# Patient Record
Sex: Female | Born: 1970 | Race: White | Hispanic: No | Smoking: Never smoker
Health system: Southern US, Community
[De-identification: ages and names within clinical notes are randomized; demographics above are authoritative.]

## PROBLEM LIST (undated history)

## (undated) DIAGNOSIS — F32A Depression, unspecified: Secondary | ICD-10-CM

## (undated) DIAGNOSIS — E079 Disorder of thyroid, unspecified: Secondary | ICD-10-CM

## (undated) DIAGNOSIS — F329 Major depressive disorder, single episode, unspecified: Secondary | ICD-10-CM

## (undated) HISTORY — PX: NASAL FRACTURE SURGERY: SHX718

---

## 2000-03-19 ENCOUNTER — Emergency Department (HOSPITAL_COMMUNITY): Admission: EM | Admit: 2000-03-19 | Discharge: 2000-03-19 | Payer: Self-pay | Admitting: Emergency Medicine

## 2000-11-16 ENCOUNTER — Other Ambulatory Visit: Admission: RE | Admit: 2000-11-16 | Discharge: 2000-11-16 | Payer: Self-pay | Admitting: Gynecology

## 2000-11-16 ENCOUNTER — Encounter (INDEPENDENT_AMBULATORY_CARE_PROVIDER_SITE_OTHER): Payer: Self-pay | Admitting: Specialist

## 2004-01-18 ENCOUNTER — Emergency Department (HOSPITAL_COMMUNITY): Admission: EM | Admit: 2004-01-18 | Discharge: 2004-01-18 | Payer: Self-pay | Admitting: Emergency Medicine

## 2004-03-08 ENCOUNTER — Emergency Department (HOSPITAL_COMMUNITY): Admission: EM | Admit: 2004-03-08 | Discharge: 2004-03-08 | Payer: Self-pay | Admitting: Emergency Medicine

## 2005-07-25 ENCOUNTER — Ambulatory Visit: Payer: Self-pay | Admitting: Gastroenterology

## 2008-12-27 ENCOUNTER — Emergency Department (HOSPITAL_BASED_OUTPATIENT_CLINIC_OR_DEPARTMENT_OTHER): Admission: EM | Admit: 2008-12-27 | Discharge: 2008-12-27 | Payer: Self-pay | Admitting: Emergency Medicine

## 2009-05-27 ENCOUNTER — Encounter: Admission: RE | Admit: 2009-05-27 | Discharge: 2009-05-27 | Payer: Self-pay | Admitting: Family Medicine

## 2010-09-22 ENCOUNTER — Ambulatory Visit
Admission: RE | Admit: 2010-09-22 | Discharge: 2010-09-22 | Disposition: A | Discharge status: Home / Self Care | Payer: 59 | Source: Ambulatory Visit | Attending: Sports Medicine | Admitting: Sports Medicine

## 2010-09-22 ENCOUNTER — Other Ambulatory Visit: Payer: Self-pay | Admitting: Sports Medicine

## 2010-09-22 DIAGNOSIS — M25561 Pain in right knee: Secondary | ICD-10-CM

## 2010-09-22 DIAGNOSIS — M545 Low back pain: Secondary | ICD-10-CM

## 2010-09-22 DIAGNOSIS — M542 Cervicalgia: Secondary | ICD-10-CM

## 2011-03-24 ENCOUNTER — Emergency Department (HOSPITAL_BASED_OUTPATIENT_CLINIC_OR_DEPARTMENT_OTHER)
Admission: EM | Admit: 2011-03-24 | Discharge: 2011-03-25 | Disposition: A | Discharge status: Home / Self Care | Payer: 59 | Attending: Emergency Medicine | Admitting: Emergency Medicine

## 2011-03-24 ENCOUNTER — Emergency Department (INDEPENDENT_AMBULATORY_CARE_PROVIDER_SITE_OTHER): Payer: 59

## 2011-03-24 ENCOUNTER — Encounter: Payer: Self-pay | Admitting: *Deleted

## 2011-03-24 DIAGNOSIS — M542 Cervicalgia: Secondary | ICD-10-CM | POA: Insufficient documentation

## 2011-03-24 DIAGNOSIS — M25519 Pain in unspecified shoulder: Secondary | ICD-10-CM

## 2011-03-24 DIAGNOSIS — Y92410 Unspecified street and highway as the place of occurrence of the external cause: Secondary | ICD-10-CM | POA: Insufficient documentation

## 2011-03-24 HISTORY — DX: Depression, unspecified: F32.A

## 2011-03-24 HISTORY — DX: Major depressive disorder, single episode, unspecified: F32.9

## 2011-03-24 MED ORDER — IBUPROFEN 800 MG PO TABS: 800.0000 mg | ORAL_TABLET | Freq: Three times a day (TID) | ORAL | Status: AC

## 2011-03-24 MED ORDER — HYDROCODONE-ACETAMINOPHEN 5-325 MG PO TABS: 2.0000 | ORAL_TABLET | ORAL | Status: AC | PRN

## 2011-03-24 MED ORDER — IBUPROFEN 800 MG PO TABS
800.0000 mg | ORAL_TABLET | Freq: Once | ORAL | Status: AC
  Administered 2011-03-24: 800 mg via ORAL
  Filled 2011-03-24: qty 1

## 2011-03-24 MED ORDER — OXYCODONE-ACETAMINOPHEN 5-325 MG PO TABS: 2.0000 | ORAL_TABLET | ORAL | Status: AC | PRN
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
## 2011-03-24 NOTE — ED Provider Notes (Signed)
Medical screening examination/treatment/procedure(s) were performed by non-physician practitioner and as supervising physician I was immediately available for consultation/collaboration.   Leigh-Ann Webb, MD 03/24/11 2326 

## 2011-03-25 ENCOUNTER — Other Ambulatory Visit (HOSPITAL_BASED_OUTPATIENT_CLINIC_OR_DEPARTMENT_OTHER): Payer: 59

## 2011-03-25 NOTE — ED Notes (Signed)
Pt placed in collar and instructed on usage. Aware to leave in place constantly for the next 2 weeks until f/u with neurosurgery. Verbalized understanding.

## 2011-04-15 NOTE — ED Provider Notes (Signed)
Medical screening examination/treatment/procedure(s) were performed by non-physician practitioner and as supervising physician I was immediately available for consultation/collaboration.   Leigh-Ann Rosilyn Coachman, MD 04/15/11 0124 

## 2011-07-17 ENCOUNTER — Emergency Department (HOSPITAL_BASED_OUTPATIENT_CLINIC_OR_DEPARTMENT_OTHER)
Admission: EM | Admit: 2011-07-17 | Discharge: 2011-07-17 | Disposition: A | Discharge status: Home / Self Care | Payer: 59 | Attending: Emergency Medicine | Admitting: Emergency Medicine

## 2011-07-17 ENCOUNTER — Emergency Department (INDEPENDENT_AMBULATORY_CARE_PROVIDER_SITE_OTHER): Payer: 59

## 2011-07-17 ENCOUNTER — Encounter (HOSPITAL_BASED_OUTPATIENT_CLINIC_OR_DEPARTMENT_OTHER): Payer: Self-pay | Admitting: *Deleted

## 2011-07-17 DIAGNOSIS — Z79899 Other long term (current) drug therapy: Secondary | ICD-10-CM | POA: Insufficient documentation

## 2011-07-17 DIAGNOSIS — R05 Cough: Secondary | ICD-10-CM

## 2011-07-17 DIAGNOSIS — R509 Fever, unspecified: Secondary | ICD-10-CM

## 2011-07-17 DIAGNOSIS — R059 Cough, unspecified: Secondary | ICD-10-CM | POA: Insufficient documentation

## 2011-07-17 DIAGNOSIS — J40 Bronchitis, not specified as acute or chronic: Secondary | ICD-10-CM | POA: Insufficient documentation

## 2011-07-17 NOTE — ED Provider Notes (Signed)
History     CSN: [id number]  Arrival date & time 07/17/11  0614   First MD Initiated Contact with Patient 07/17/11 [phone]      Chief Complaint  Patient presents with  . Nasal Congestion  . Cough    (Consider location/radiation/quality/duration/timing/severity/associated sxs/prior treatment) HPI  Patient states she has had uri symptoms which then changed to cough and subjective fever 2 days ago.  Some dyspnea with occasionally productive cough.  No history of asthma.  Patient taking po well.  Some sick contacts in house hold.  No history of pneumonia, asthma, and not a smoker.    Past Medical History  Diagnosis Date  . Depression   . Arthritis     History reviewed. No pertinent past surgical history.  History reviewed. No pertinent family history.  History  Substance Use Topics  . Smoking status: Never Smoker   . Smokeless tobacco: Not on file  . Alcohol Use: No    OB History    Grav Para Term Preterm Abortions TAB SAB Ect Mult Living                  Review of Systems  All other systems reviewed and are negative.    Allergies  Review of patient's allergies indicates no known allergies.  Home Medications   Current Outpatient Rx  Name Route Sig Dispense Refill  . CITALOPRAM HYDROBROMIDE 40 MG PO TABS Oral Take 40 mg by mouth daily.      Marland Kitchen LEVOTHYROXINE SODIUM 75 MCG PO TABS Oral Take 75 mcg by mouth daily.      Marland Kitchen BETAMETHASONE DIPROPIONATE AUG 0.05 % EX OINT Topical Apply topically as needed. For flare up     . CELECOXIB 200 MG PO CAPS Oral Take 200 mg by mouth daily.      Marland Kitchen FLUOXETINE HCL 20 MG PO CAPS Oral Take 20 mg by mouth daily.        BP 123/71  Pulse 86  Temp(Src) 97.8 F (36.6 C) (Oral)  Resp 14  Ht 5\' 5"  (1.651 m)  Wt 238 lb (107.956 kg)  BMI 39.61 kg/m2  SpO2 96%  LMP 06/21/2011  Physical Exam  Nursing note and vitals reviewed. Constitutional: She is oriented to person, place, and time. She appears well-developed and well-nourished.         Morbidly obese  HENT:  Head: Normocephalic and atraumatic.  Right Ear: External ear normal.  Left Ear: External ear normal.  Nose: Nose normal.  Mouth/Throat: Oropharynx is clear and moist.  Eyes: Conjunctivae are normal. Pupils are equal, round, and reactive to light.  Neck: Normal range of motion. Neck supple.  Cardiovascular: Normal rate and regular rhythm.   Pulmonary/Chest: Effort normal and breath sounds normal.  Abdominal: Soft.  Musculoskeletal: Normal range of motion.  Neurological: She is alert and oriented to person, place, and time.  Skin: Skin is warm and dry.  Psychiatric: She has a normal mood and affect.    ED Course  Procedures (including critical care time)  Labs Reviewed - No data to display No results found.   No diagnosis found.    MDM          Hilario Quarry, MD 07/17/11 [phone]

## 2011-07-17 NOTE — ED Notes (Signed)
Pt presented to the ED with no respiratory hx but has had a cold.

## 2011-07-17 NOTE — ED Notes (Signed)
Pt states that she has had URI sx x one weeks states that she feels like it has gotten into her chest she may have had a fever yesterday but is not sure

## 2011-07-17 NOTE — ED Notes (Signed)
Pt ambulatory to radiology

## 2011-07-17 NOTE — ED Notes (Signed)
Report received from [place], [place] care assumed.

## 2012-03-15 ENCOUNTER — Encounter (HOSPITAL_BASED_OUTPATIENT_CLINIC_OR_DEPARTMENT_OTHER): Payer: Self-pay | Admitting: Emergency Medicine

## 2012-03-15 ENCOUNTER — Emergency Department (HOSPITAL_BASED_OUTPATIENT_CLINIC_OR_DEPARTMENT_OTHER)
Admission: EM | Admit: 2012-03-15 | Discharge: 2012-03-15 | Disposition: A | Discharge status: Home / Self Care | Payer: No Typology Code available for payment source | Attending: Emergency Medicine | Admitting: Emergency Medicine

## 2012-03-15 ENCOUNTER — Emergency Department (HOSPITAL_BASED_OUTPATIENT_CLINIC_OR_DEPARTMENT_OTHER): Payer: No Typology Code available for payment source

## 2012-03-15 DIAGNOSIS — M129 Arthropathy, unspecified: Secondary | ICD-10-CM | POA: Insufficient documentation

## 2012-03-15 DIAGNOSIS — S5010XA Contusion of unspecified forearm, initial encounter: Secondary | ICD-10-CM

## 2012-03-15 DIAGNOSIS — F329 Major depressive disorder, single episode, unspecified: Secondary | ICD-10-CM | POA: Insufficient documentation

## 2012-03-15 DIAGNOSIS — S2239XA Fracture of one rib, unspecified side, initial encounter for closed fracture: Secondary | ICD-10-CM | POA: Insufficient documentation

## 2012-03-15 DIAGNOSIS — F3289 Other specified depressive episodes: Secondary | ICD-10-CM | POA: Insufficient documentation

## 2012-03-15 HISTORY — DX: Disorder of thyroid, unspecified: E07.9

## 2012-03-15 MED ORDER — OXYCODONE-ACETAMINOPHEN 5-325 MG PO TABS
2.0000 | ORAL_TABLET | Freq: Once | ORAL | Status: AC
  Administered 2012-03-15: 2 via ORAL
  Filled 2012-03-15 (×2): qty 2

## 2012-03-15 MED ORDER — IBUPROFEN 800 MG PO TABS: 800.0000 mg | ORAL_TABLET | Freq: Three times a day (TID) | ORAL | Status: AC | PRN

## 2012-03-15 MED ORDER — OXYCODONE-ACETAMINOPHEN 5-325 MG PO TABS
ORAL_TABLET | ORAL | Status: AC
  Filled 2012-03-15: qty 2

## 2012-03-15 MED ORDER — ONDANSETRON HCL 4 MG/2ML IJ SOLN
INTRAMUSCULAR | Status: AC
  Filled 2012-03-15: qty 2

## 2012-03-15 MED ORDER — PROMETHAZINE HCL 25 MG PO TABS
ORAL_TABLET | ORAL | Status: AC
  Filled 2012-03-15: qty 1

## 2012-03-15 MED ORDER — OXYCODONE-ACETAMINOPHEN 5-325 MG PO TABS: 1.0000 | ORAL_TABLET | Freq: Four times a day (QID) | ORAL | Status: AC | PRN

## 2012-03-15 MED ORDER — KETOROLAC TROMETHAMINE 30 MG/ML IJ SOLN
INTRAMUSCULAR | Status: AC
  Filled 2012-03-15: qty 1
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
## 2016-08-13 ENCOUNTER — Encounter (HOSPITAL_BASED_OUTPATIENT_CLINIC_OR_DEPARTMENT_OTHER): Payer: Self-pay | Admitting: *Deleted

## 2016-08-13 ENCOUNTER — Emergency Department (HOSPITAL_BASED_OUTPATIENT_CLINIC_OR_DEPARTMENT_OTHER)
Admission: EM | Admit: 2016-08-13 | Discharge: 2016-08-13 | Disposition: A | Discharge status: Home / Self Care | Payer: Self-pay | Attending: Emergency Medicine | Admitting: Emergency Medicine

## 2016-08-13 ENCOUNTER — Emergency Department (HOSPITAL_BASED_OUTPATIENT_CLINIC_OR_DEPARTMENT_OTHER): Payer: Self-pay

## 2016-08-13 DIAGNOSIS — Y929 Unspecified place or not applicable: Secondary | ICD-10-CM | POA: Insufficient documentation

## 2016-08-13 DIAGNOSIS — W010XXA Fall on same level from slipping, tripping and stumbling without subsequent striking against object, initial encounter: Secondary | ICD-10-CM | POA: Insufficient documentation

## 2016-08-13 DIAGNOSIS — S161XXA Strain of muscle, fascia and tendon at neck level, initial encounter: Secondary | ICD-10-CM | POA: Insufficient documentation

## 2016-08-13 DIAGNOSIS — Y999 Unspecified external cause status: Secondary | ICD-10-CM | POA: Insufficient documentation

## 2016-08-13 DIAGNOSIS — Y939 Activity, unspecified: Secondary | ICD-10-CM | POA: Insufficient documentation

## 2016-08-13 DIAGNOSIS — W19XXXA Unspecified fall, initial encounter: Secondary | ICD-10-CM

## 2016-08-13 MED ORDER — CYCLOBENZAPRINE HCL 10 MG PO TABS: 10.0000 mg | ORAL_TABLET | Freq: Two times a day (BID) | ORAL | 0 refills | Status: AC | PRN

## 2016-08-13 NOTE — ED Triage Notes (Signed)
Pt c/o fall x 4 days ago c/o neck and bil shoulder pain
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
## 2016-08-13 NOTE — ED Notes (Signed)
Pt made aware to return if symptoms worsen or if any life threatening symptoms occur.   

## 2016-08-13 NOTE — ED Notes (Signed)
PA at bedside.

## 2016-08-13 NOTE — Discharge Instructions (Signed)
Your x-rays are normal today. Continue taking Naprosyn for pain. He may also take Flexeril for pain. Do not drive with taking this medicine. Follow-up with her primary care doctor in 1 week. Return to the emergency department for any new or concerning symptoms.

## 2017-10-09 IMAGING — DX DG CERVICAL SPINE COMPLETE 4+V
6 series · 6 of 6 positions shown · non-contrast
Comparison: 04/17/2011

CLINICAL DATA: Fall 4 days ago, left side neck pain, bilateral
shoulder pain

EXAM:
CERVICAL SPINE - COMPLETE 4+ VIEW

[c-spine lat]
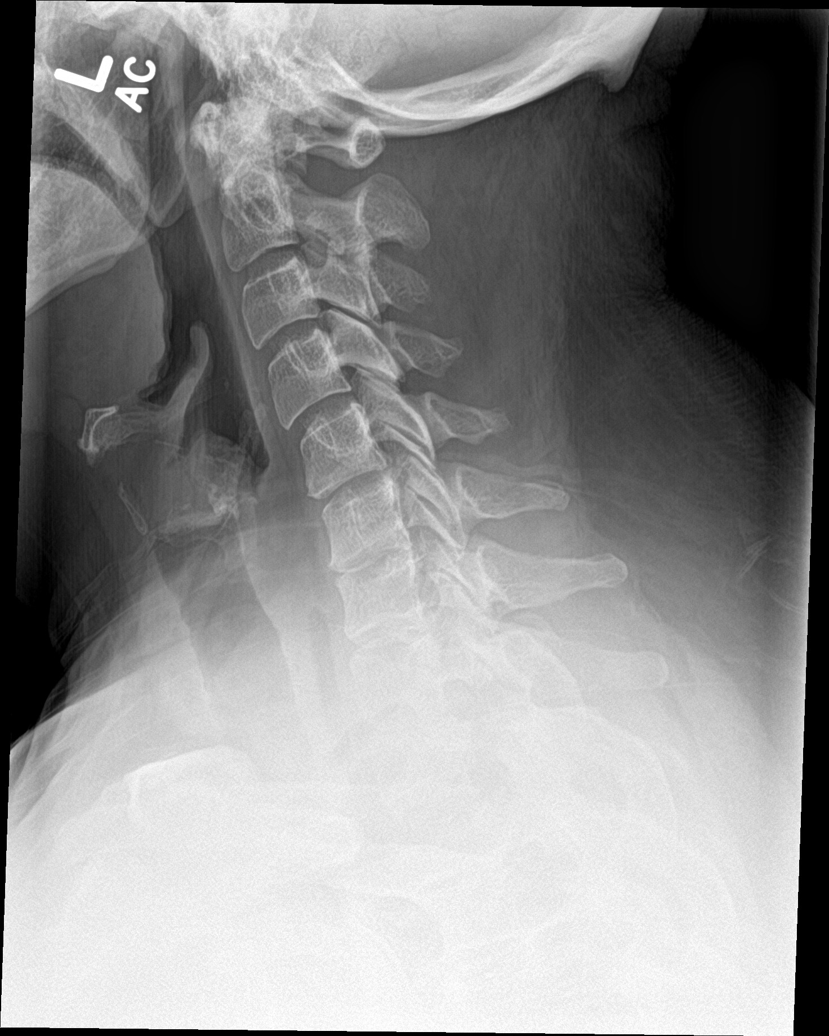

[c-spine obl (1 of 2)]
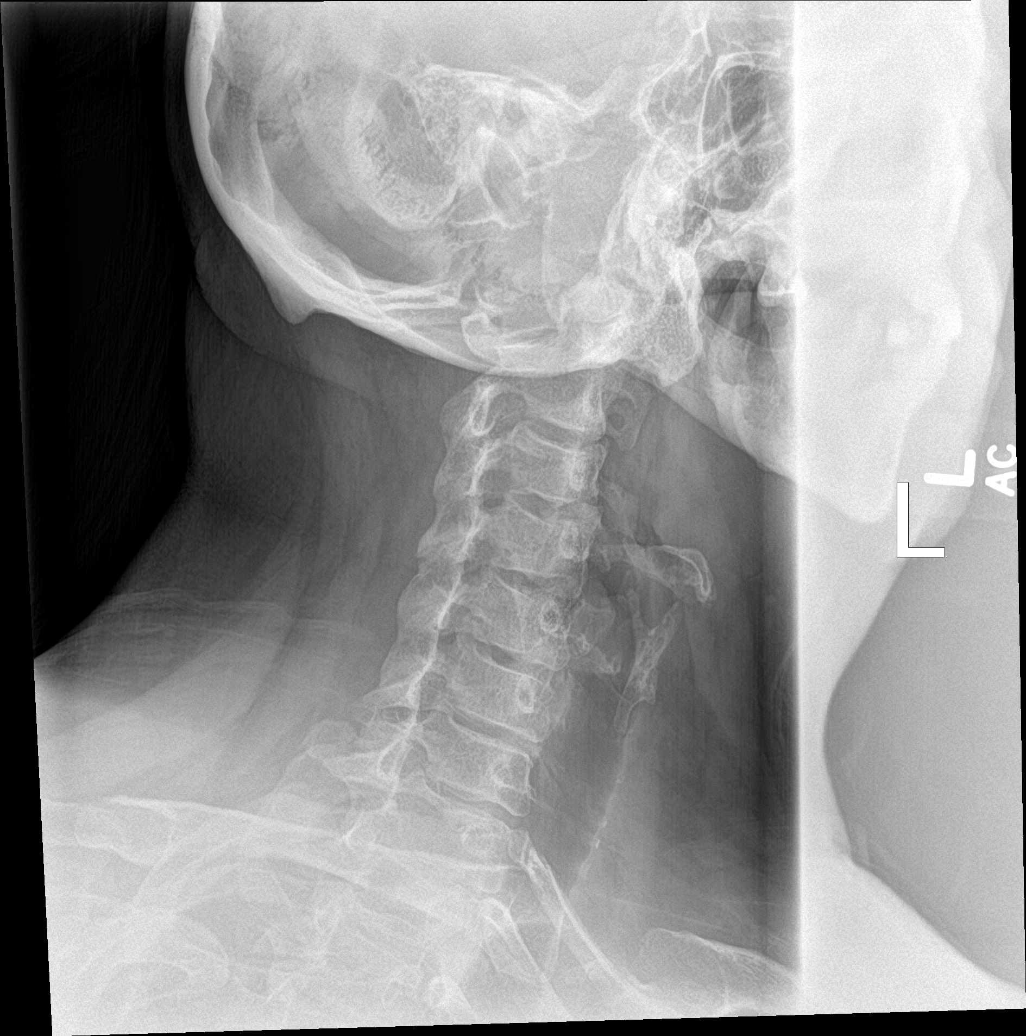

[c-spine obl (2 of 2)]
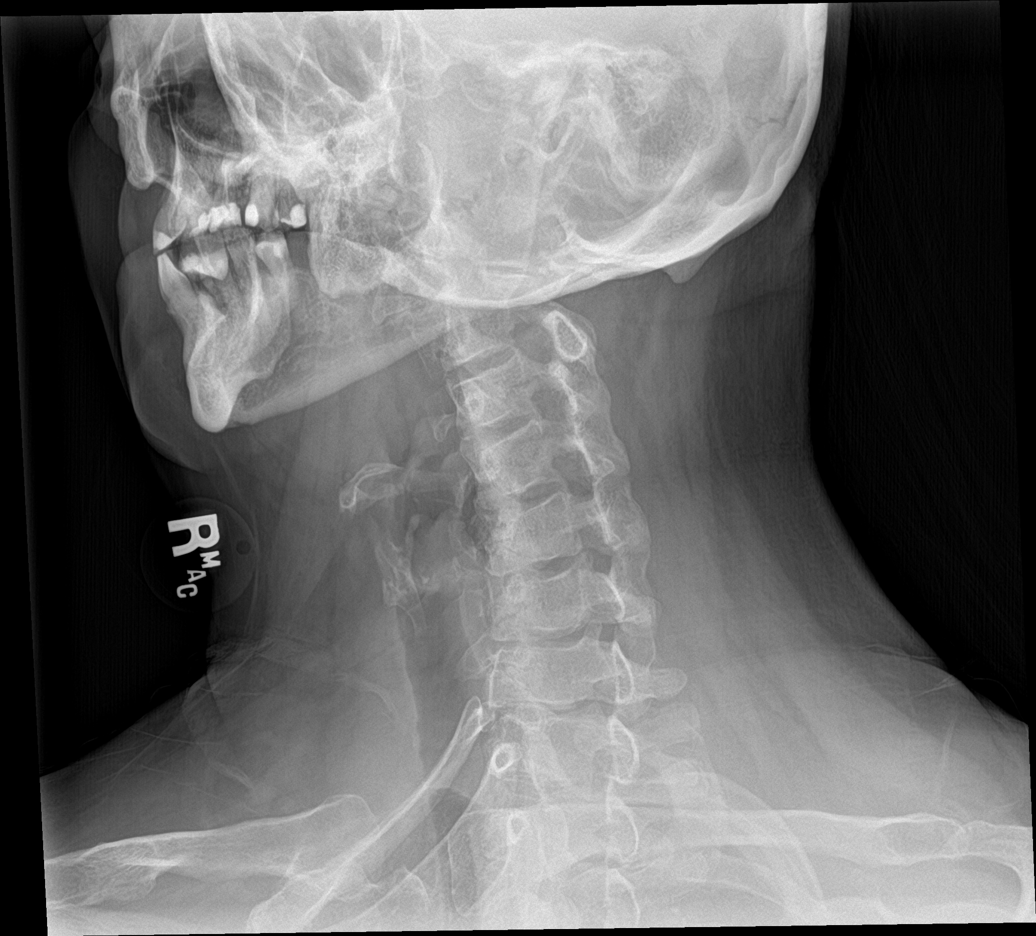

[c-spine ap]
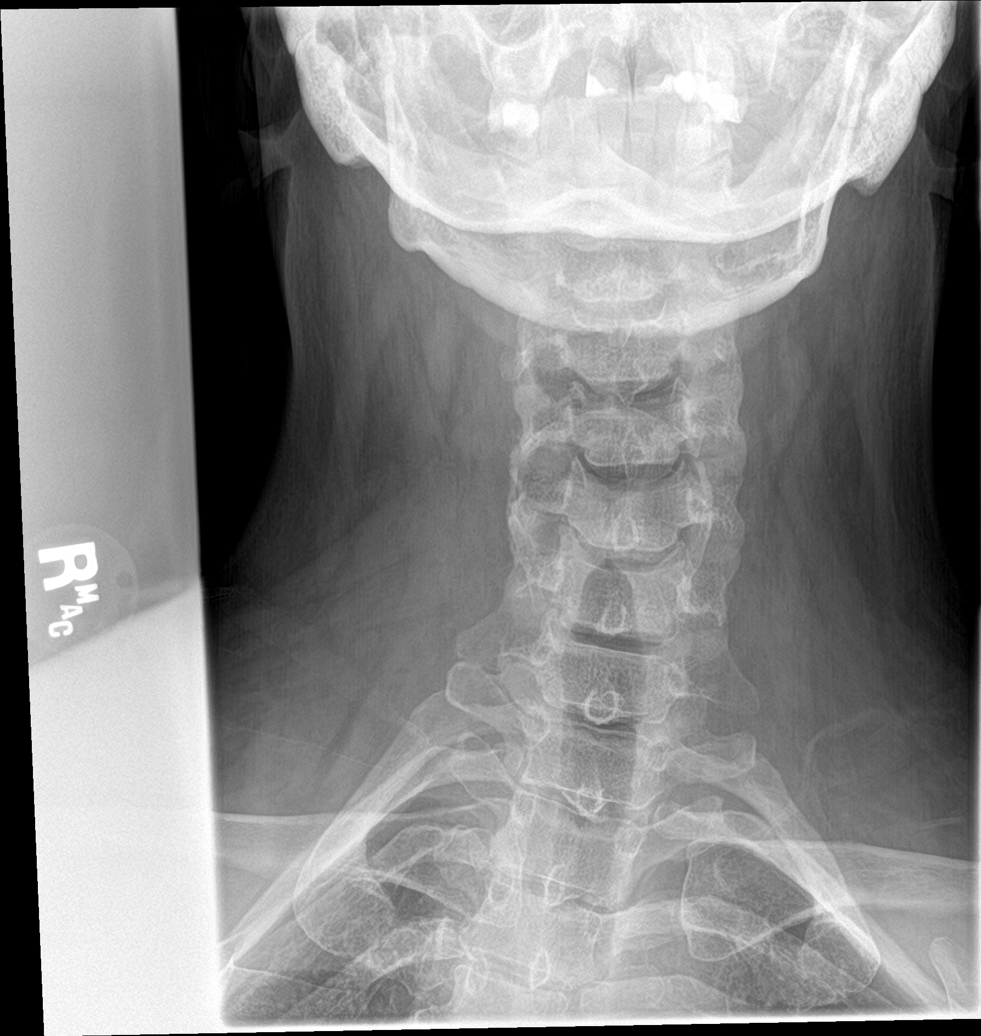

[c-spine open mouth]
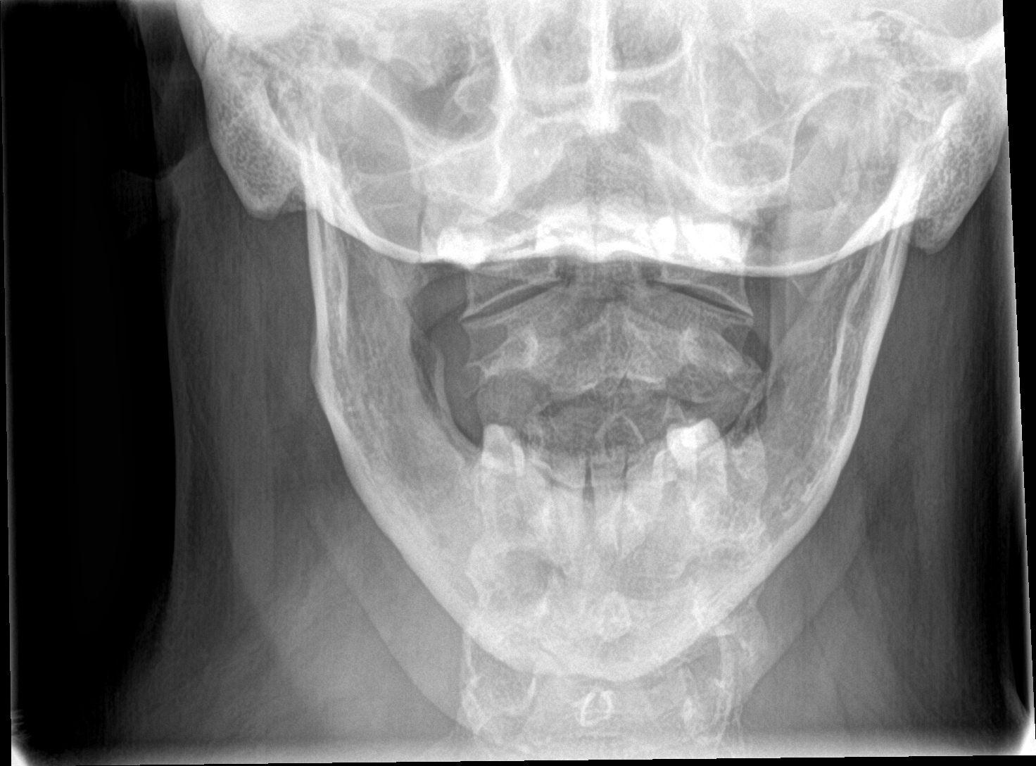

[[person_name]]
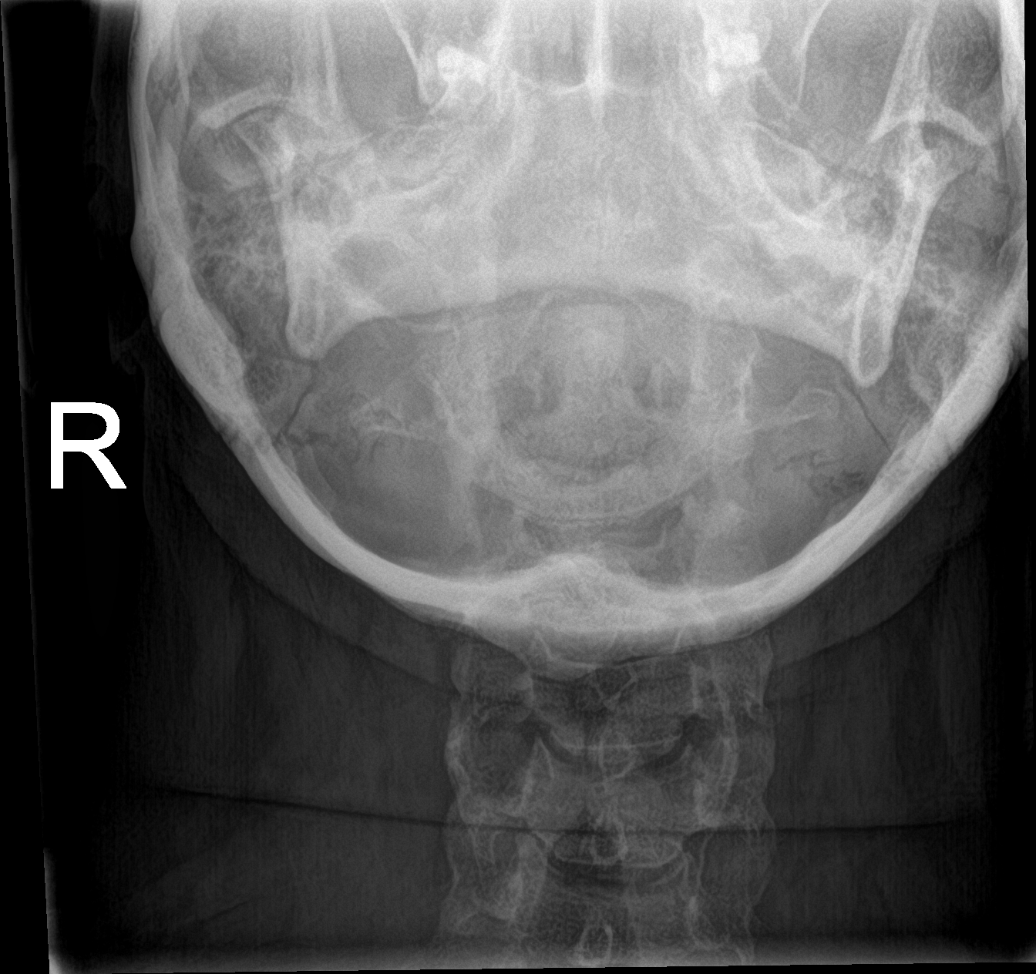

[6 of 6 positions shown; findings below may reference images not displayed]

FINDINGS: 6 views of the cervical spine submitted. No acute fracture or
subluxation. Again noted mild reversal of cervical lordosis. Mild
disc space flattening with minimal anterior spurring at C5-C6 and
C6-C7 level. Minimal disc space flattening at C7-T1 level. No
prevertebral soft tissue swelling. Cervical airway is patent. C1-C2
relationship is unremarkable.
IMPRESSION: No acute fracture or subluxation. Mild degenerative changes as
described above.

## 2018-05-31 ENCOUNTER — Emergency Department (HOSPITAL_BASED_OUTPATIENT_CLINIC_OR_DEPARTMENT_OTHER)
Admission: EM | Admit: 2018-05-31 | Discharge: 2018-05-31 | Disposition: A | Discharge status: Home / Self Care | Payer: Self-pay | Attending: Emergency Medicine | Admitting: Emergency Medicine

## 2018-05-31 ENCOUNTER — Encounter (HOSPITAL_BASED_OUTPATIENT_CLINIC_OR_DEPARTMENT_OTHER): Payer: Self-pay | Admitting: *Deleted

## 2018-05-31 ENCOUNTER — Other Ambulatory Visit: Payer: Self-pay

## 2018-05-31 ENCOUNTER — Emergency Department (HOSPITAL_BASED_OUTPATIENT_CLINIC_OR_DEPARTMENT_OTHER): Payer: Self-pay

## 2018-05-31 DIAGNOSIS — R05 Cough: Secondary | ICD-10-CM | POA: Insufficient documentation

## 2018-05-31 DIAGNOSIS — R609 Edema, unspecified: Secondary | ICD-10-CM | POA: Insufficient documentation

## 2018-05-31 DIAGNOSIS — Z79899 Other long term (current) drug therapy: Secondary | ICD-10-CM | POA: Insufficient documentation

## 2018-05-31 LAB — BASIC METABOLIC PANEL
ANION GAP: 7 (ref 5–15)
BUN: 12 mg/dL (ref 6–20)
CHLORIDE: 104 mmol/L (ref 98–111)
CO2: 26 mmol/L (ref 22–32)
Calcium: 9.4 mg/dL (ref 8.9–10.3)
Creatinine, Ser: 0.7 mg/dL (ref 0.44–1.00)
GFR calc non Af Amer: >60 mL/min (ref >60)
GLUCOSE: 132 mg/dL — AB (ref 70–99)
Potassium: 3.6 mmol/L (ref 3.5–5.1)
Sodium: 137 mmol/L (ref 135–145)

## 2018-05-31 MED ORDER — FUROSEMIDE 20 MG PO TABS: 20.0000 mg | ORAL_TABLET | Freq: Every day | ORAL | 0 refills | Status: AC

## 2018-05-31 NOTE — ED Triage Notes (Signed)
pt c/ left leg swelling x 2 weeks also c/o sinus pain

## 2018-05-31 NOTE — ED Provider Notes (Signed)
[facility] EMERGENCY DEPARTMENT Provider Note   CSN: [id number] Arrival date & time: 05/31/18  1425     History   Chief Complaint Chief Complaint  Patient presents with  . Leg Swelling    HPI [name] is a 47 y.o. female.  HPI Patient presents with 2 weeks of left lower extremity swellings.  States it is swollen but not painful.  Has a little bit of a cough.  No real trouble breathing.  No chest pain.  Has not had swelling like this before.  Does have history of psoriasis.  No change in her urination. Past Medical History:  Diagnosis Date  . Depression   . Thyroid disease     There are no active problems to display for this patient.   Past Surgical History:  Procedure Laterality Date  . NASAL FRACTURE SURGERY       OB History   None      Home Medications    Prior to Admission medications   Medication Sig Start Date End Date Taking? Authorizing Provider  augmented betamethasone dipropionate (DIPROLENE-AF) 0.05 % ointment Apply topically as needed. For flare up     [provider]  citalopram (CELEXA) 40 MG tablet Take 40 mg by mouth daily.      [provider]  cyclobenzaprine (FLEXERIL) 10 MG tablet Take 1 tablet (10 mg total) by mouth 2 (two) times daily as needed for muscle spasms. 08/13/16   Cheri Fowler, PA-C  FLUoxetine (PROZAC) 20 MG capsule Take 20 mg by mouth daily.      [provider]  furosemide (LASIX) 20 MG tablet Take 1 tablet (20 mg total) by mouth daily. 05/31/18   Benjiman Core, MD  levothyroxine (SYNTHROID, LEVOTHROID) 75 MCG tablet Take 75 mcg by mouth daily.      [provider]    Family History History reviewed. No pertinent family history.  Social History Social History   Tobacco Use  . Smoking status: Never Smoker  . Smokeless tobacco: Never Used  Substance Use Topics  . Alcohol use: Yes    Comment: oca  . Drug use: No     Allergies   Patient has no known  allergies.   Review of Systems Review of Systems  Constitutional: Negative for appetite change.  HENT: Positive for congestion.   Respiratory: Positive for cough.   Cardiovascular: Positive for leg swelling. Negative for chest pain.  Gastrointestinal: Negative for abdominal distention.  Genitourinary: Negative for flank pain.  Musculoskeletal: Negative for back pain.  Skin: Negative for rash.  Neurological: Negative for weakness.  Hematological: Negative for adenopathy.  Psychiatric/Behavioral: Negative for agitation and confusion.     Physical Exam Updated Vital Signs BP (!) 156/85 (BP Location: Right Arm)   Pulse 98   Temp 98.3 F (36.8 C) (Oral)   Resp 20   Ht 5\' 5"  (1.651 m)   Wt 122.5 kg   LMP 05/28/2018   SpO2 99%   BMI 44.93 kg/m   Physical Exam  Constitutional: She appears well-developed.  HENT:  Head: Normocephalic.  Eyes: Pupils are equal, round, and reactive to light.  Cardiovascular: Normal rate.  Pulmonary/Chest: She has no wheezes. She has no rales.  Abdominal: There is no guarding.  Musculoskeletal: She exhibits edema.  Pitting edema bilateral lower extremities.  Moderate on right but worse on left.  No weeping.  No erythema.  Pulse intact bilaterally.  Neurological: She is alert.  Skin: Capillary refill takes less than 2  seconds. Rash noted.  Psoriasis plaque on left anterior lower leg.  Also on right neck.     ED Treatments / Results  Labs (all labs ordered are listed, but only abnormal results are displayed) Labs Reviewed  BASIC METABOLIC PANEL - Abnormal; Notable for the following components:      Result Value   Glucose, Bld 132 (*)    All other components within normal limits    EKG None  Radiology US Venous Img Lower Unilateral Left  Result Date: 05/31/2018 CLINICAL DATA:  47 year old female with left foot and ankle swelling and discomfort for the past 2 weeks. EXAM: LEFT LOWER EXTREMITY VENOUS DOPPLER ULTRASOUND TECHNIQUE:  Gray-scale sonography with graded compression, as well as color Doppler and duplex ultrasound were performed to evaluate the lower extremity deep venous systems from the level of the common femoral vein and including the common femoral, femoral, profunda femoral, popliteal and calf veins including the posterior tibial, peroneal and gastrocnemius veins when visible. The superficial great saphenous vein was also interrogated. Spectral Doppler was utilized to evaluate flow at rest and with distal augmentation maneuvers in the common femoral, femoral and popliteal veins. COMPARISON:  None. FINDINGS: Contralateral Common Femoral Vein: Respiratory phasicity is normal and symmetric with the symptomatic side. No evidence of thrombus. Normal compressibility. Common Femoral Vein: No evidence of thrombus. Normal compressibility, respiratory phasicity and response to augmentation. Saphenofemoral Junction: No evidence of thrombus. Normal compressibility and flow on color Doppler imaging. Profunda Femoral Vein: No evidence of thrombus. Normal compressibility and flow on color Doppler imaging. Femoral Vein: No evidence of thrombus. Normal compressibility, respiratory phasicity and response to augmentation. Popliteal Vein: No evidence of thrombus. Normal compressibility, respiratory phasicity and response to augmentation. Calf Veins: No evidence of thrombus. Normal compressibility and flow on color Doppler imaging. Superficial Great Saphenous Vein: No evidence of thrombus. Normal compressibility. Venous Reflux:  None. Other Findings: Hypoechoic fluid collection in the medial popliteal fossa measures 5.4 x 1.4 x 4.1 cm consistent with a Baker's cyst. IMPRESSION: No evidence of deep venous thrombosis to the knee or within the posterior tibial veins. The peroneal veins were poorly seen secondary to patient body habitus. Baker's cyst present in the popliteal fossa. Electronically Signed   By: Malachy Moan M.D.   On: 05/31/2018  17:36    Procedures Procedures (including critical care time)  Medications Ordered in ED Medications - No data to display   Initial Impression / Assessment and Plan / ED Course  I have reviewed the triage vital signs and the nursing notes.  Pertinent labs & imaging results that were available during my care of the patient were reviewed by me and considered in my medical decision making (see chart for details).     Patient with leg swelling.  Some edema bilaterally negative Doppler.  Low-dose Lasix with outpatient PCP follow-up with  Final Clinical Impressions(s) / ED Diagnoses   Final diagnoses:  Peripheral edema    ED Discharge Orders         Ordered    furosemide (LASIX) 20 MG tablet  Daily     05/31/18 1759           Benjiman Core, MD 05/31/18 2334

## 2018-05-31 NOTE — Discharge Instructions (Signed)
Follow up with your primary care doctor.

## 2018-05-31 NOTE — ED Notes (Signed)
Pt/family verbalized understanding of discharge instructions.   

## 2020-05-28 IMAGING — US US EXTREM LOW VENOUS*L*
1 series · 13 of 24 positions shown · non-contrast
Comparison: None.

CLINICAL DATA: 47-year-old female with left foot and ankle swelling
and discomfort for the past 2 weeks.



[Series 1: us extrem low venous*left* · 0.10mm/px · 13 of 38 slices shown]
[im 1/38]
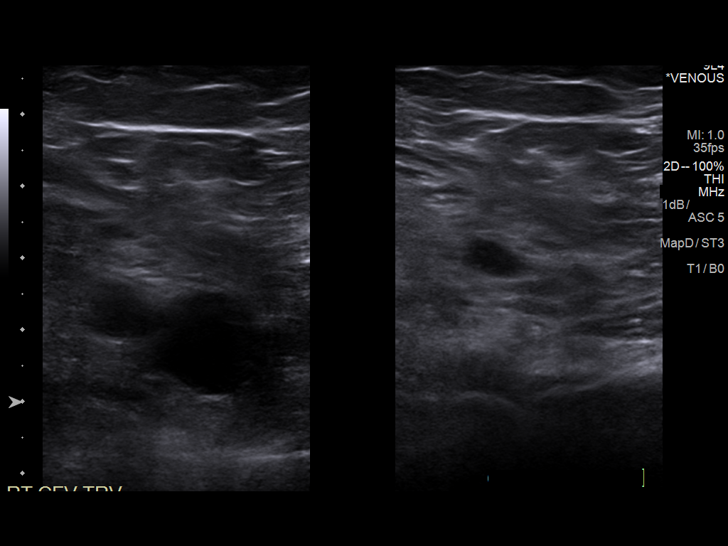
[im 4/38]
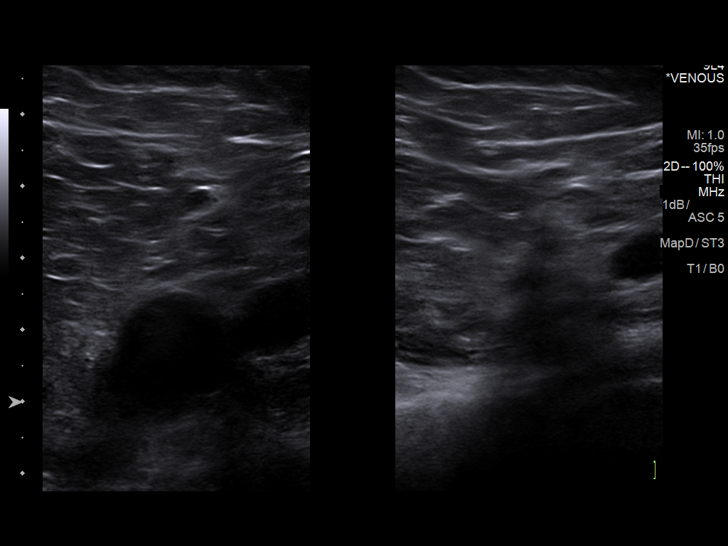
[im 7/38]
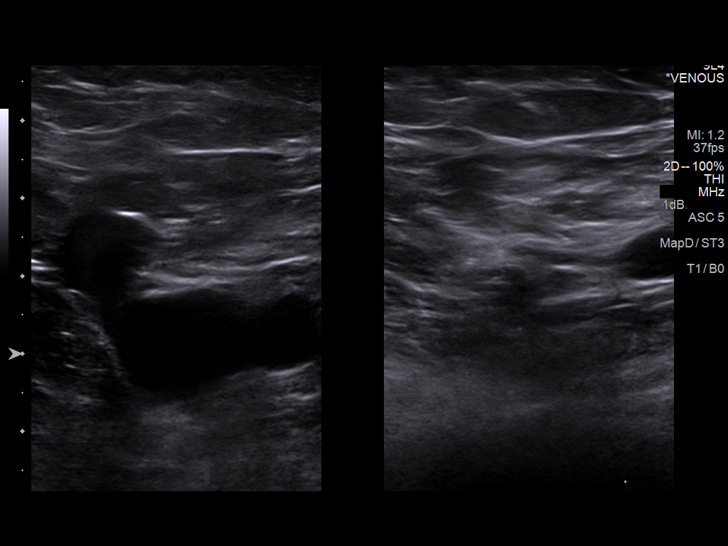
[im 10/38]
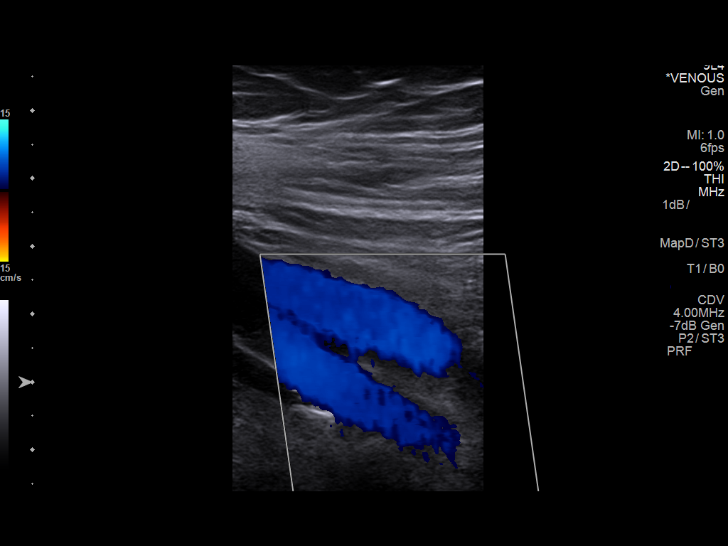
[im 13/38]
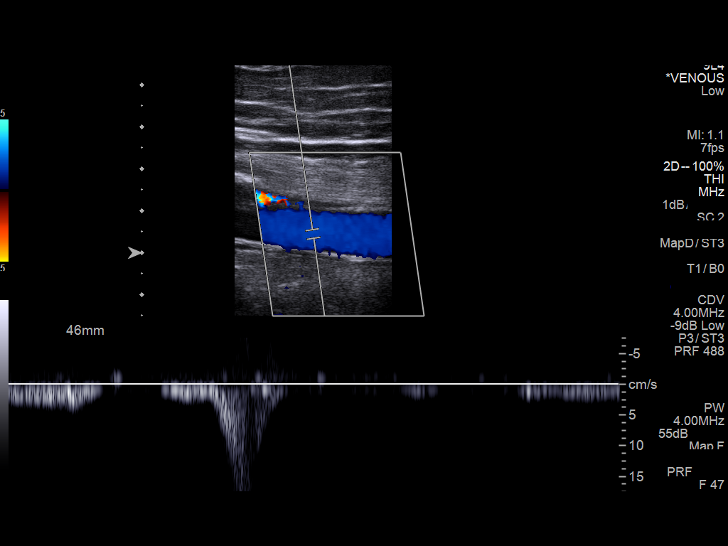
[im 17/38]
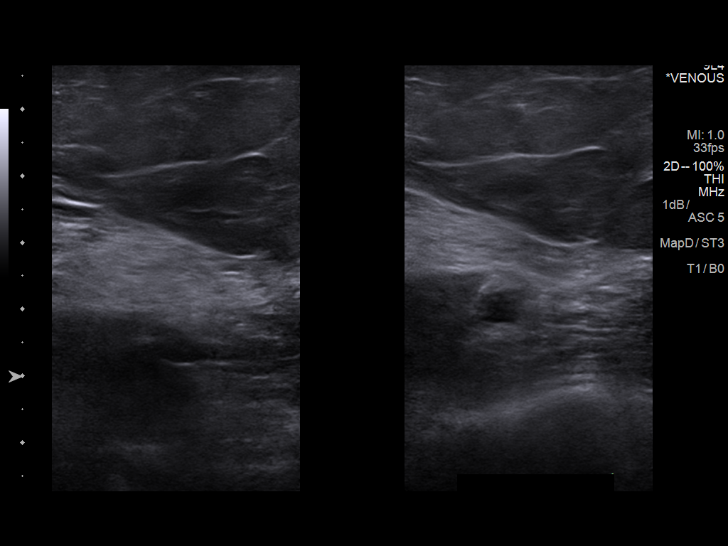
[im 20/38]
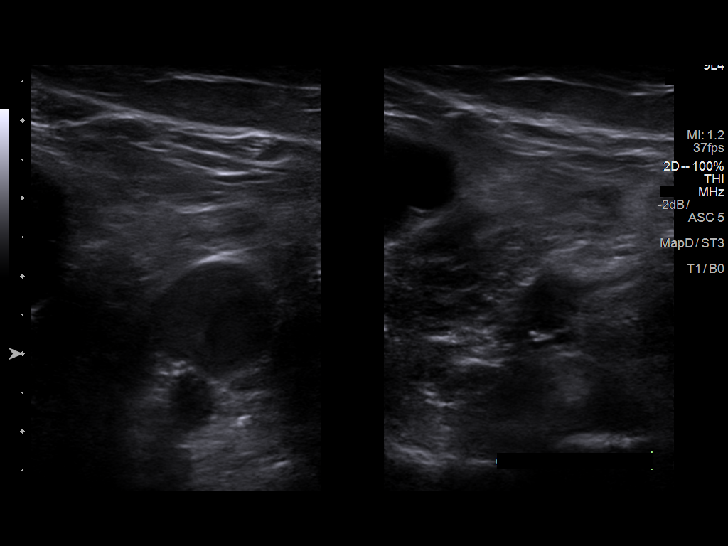
[im 21/38]
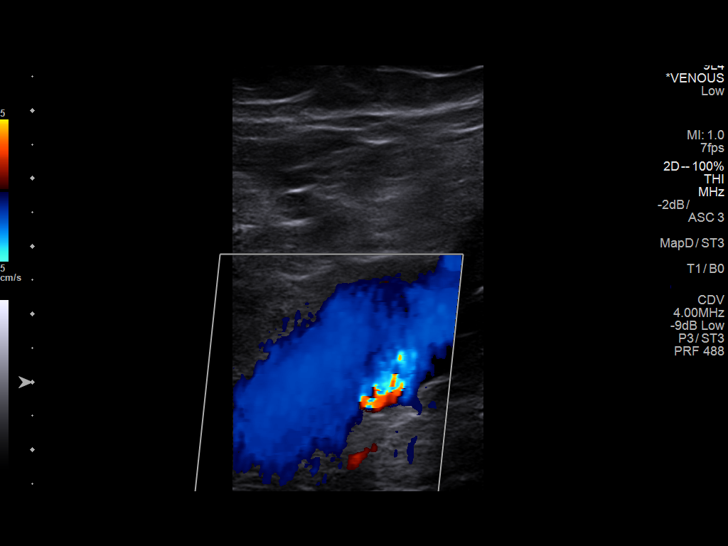
[im 25/38]
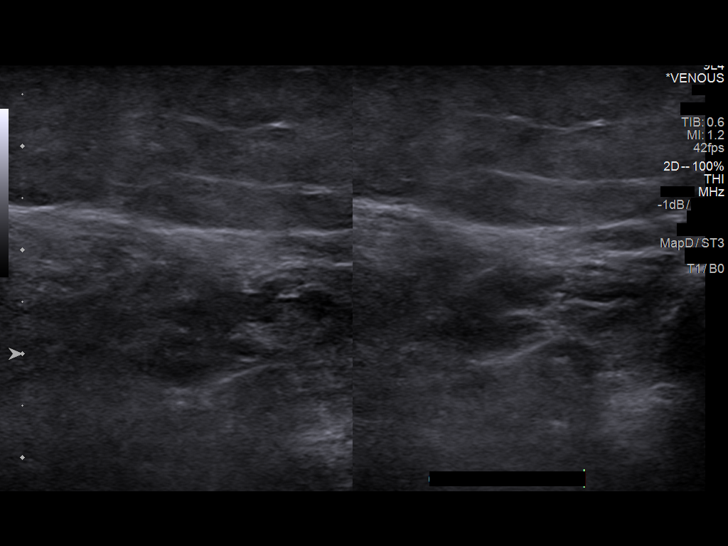
[im 28/38]
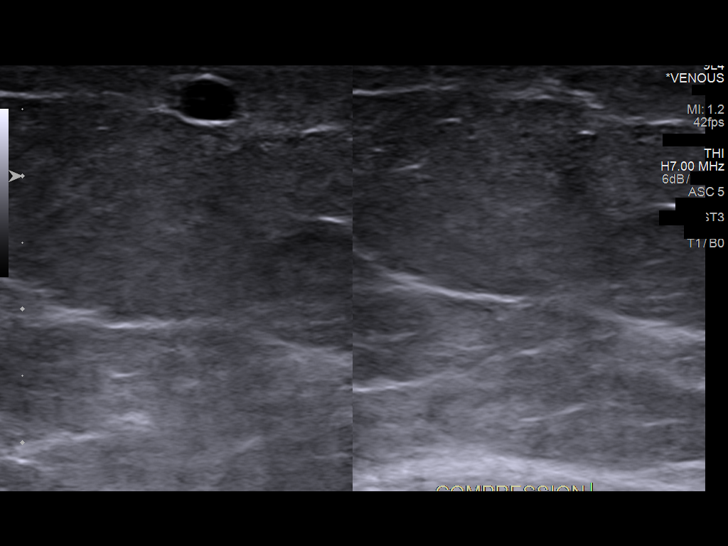
[im 31/38]
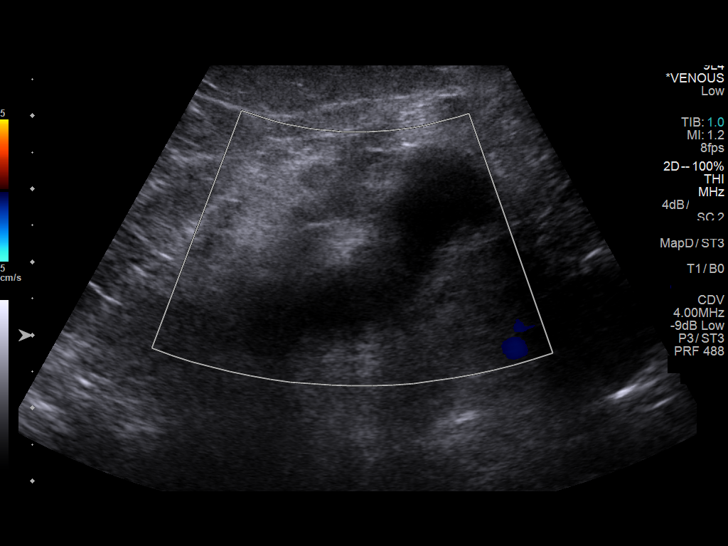
[im 34/38]
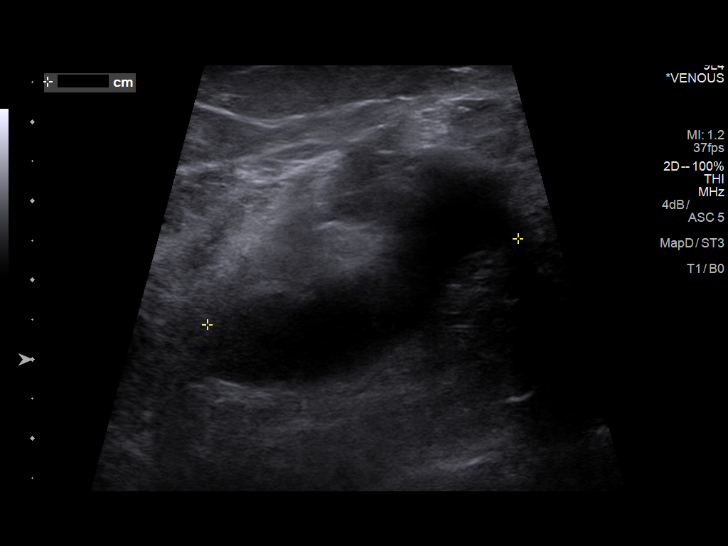
[im 38/38]
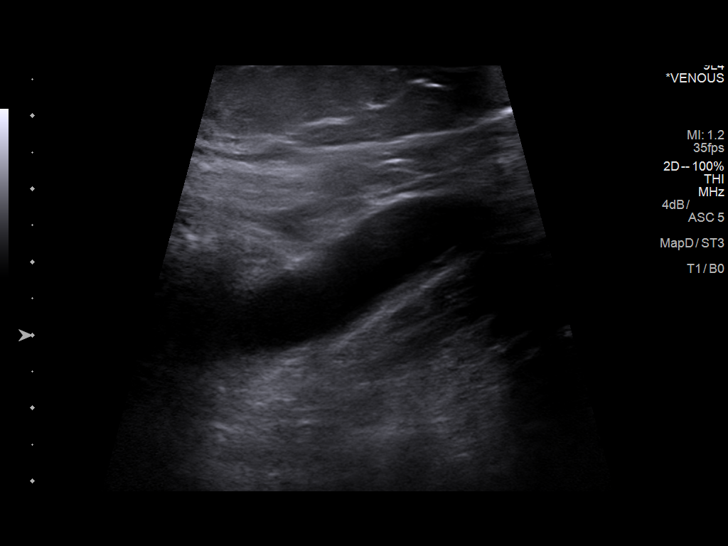

[13 of 24 positions shown; findings below may reference images not displayed]

FINDINGS: Contralateral Common Femoral Vein: Respiratory phasicity is normal
and symmetric with the symptomatic side. No evidence of thrombus.
Normal compressibility.

Common Femoral Vein: No evidence of thrombus. Normal
compressibility, respiratory phasicity and response to augmentation.

Saphenofemoral Junction: No evidence of thrombus. Normal
compressibility and flow on color Doppler imaging.

Profunda Femoral Vein: No evidence of thrombus. Normal
compressibility and flow on color Doppler imaging.

Femoral Vein: No evidence of thrombus. Normal compressibility,
respiratory phasicity and response to augmentation.

Popliteal Vein: No evidence of thrombus. Normal compressibility,
respiratory phasicity and response to augmentation.

Calf Veins: No evidence of thrombus. Normal compressibility and flow
on color Doppler imaging.

Superficial Great Saphenous Vein: No evidence of thrombus. Normal
compressibility.

Venous Reflux:  None.

Other Findings: Hypoechoic fluid collection in the medial popliteal
fossa measures 5.4 x 1.4 x 4.1 cm consistent with a Baker's cyst.
IMPRESSION: No evidence of deep venous thrombosis to the knee or within the
posterior tibial veins. The peroneal veins were poorly seen
secondary to patient body habitus.

Baker's cyst present in the popliteal fossa.

## 2022-10-12 ENCOUNTER — Other Ambulatory Visit (HOSPITAL_BASED_OUTPATIENT_CLINIC_OR_DEPARTMENT_OTHER): Payer: Self-pay

## 2022-10-12 ENCOUNTER — Encounter (HOSPITAL_BASED_OUTPATIENT_CLINIC_OR_DEPARTMENT_OTHER): Payer: Self-pay | Admitting: Emergency Medicine

## 2022-10-12 ENCOUNTER — Emergency Department (HOSPITAL_BASED_OUTPATIENT_CLINIC_OR_DEPARTMENT_OTHER): Payer: BC Managed Care – PPO

## 2022-10-12 ENCOUNTER — Emergency Department (HOSPITAL_BASED_OUTPATIENT_CLINIC_OR_DEPARTMENT_OTHER)
Admission: EM | Admit: 2022-10-12 | Discharge: 2022-10-12 | Disposition: A | Discharge status: Home / Self Care | Payer: BC Managed Care – PPO | Attending: Emergency Medicine | Admitting: Emergency Medicine

## 2022-10-12 ENCOUNTER — Other Ambulatory Visit: Payer: Self-pay

## 2022-10-12 DIAGNOSIS — R519 Headache, unspecified: Secondary | ICD-10-CM | POA: Insufficient documentation

## 2022-10-12 LAB — HCG, SERUM, QUALITATIVE: Preg, Serum: NEGATIVE

## 2022-10-12 LAB — BASIC METABOLIC PANEL
Anion gap: 3 — ABNORMAL LOW (ref 5–15)
BUN: 15 mg/dL (ref 6–20)
CO2: 30 mmol/L (ref 22–32)
Calcium: 9.6 mg/dL (ref 8.9–10.3)
Chloride: 106 mmol/L (ref 98–111)
Creatinine, Ser: 0.82 mg/dL (ref 0.44–1.00)
GFR, Estimated: >60 mL/min (ref >60)
Glucose, Bld: 92 mg/dL (ref 70–99)
Potassium: 4.4 mmol/L (ref 3.5–5.1)
Sodium: 139 mmol/L (ref 135–145)

## 2022-10-12 LAB — CBC WITH DIFFERENTIAL/PLATELET
Abs Immature Granulocytes: 0.02 10*3/uL (ref 0.00–0.07)
Basophils Absolute: 0 10*3/uL (ref 0.0–0.1)
Basophils Relative: 1 %
Eosinophils Absolute: 0.3 10*3/uL (ref 0.0–0.5)
Eosinophils Relative: 8 %
HCT: 38.2 % (ref 36.0–46.0)
Hemoglobin: 13 g/dL (ref 12.0–15.0)
Immature Granulocytes: 1 %
Lymphocytes Relative: 28 %
Lymphs Abs: 0.9 10*3/uL (ref 0.7–4.0)
MCH: 32 pg (ref 26.0–34.0)
MCHC: 34 g/dL (ref 30.0–36.0)
MCV: 94.1 fL (ref 80.0–100.0)
Monocytes Absolute: 0.5 10*3/uL (ref 0.1–1.0)
Monocytes Relative: 16 %
Neutro Abs: 1.6 10*3/uL — ABNORMAL LOW (ref 1.7–7.7)
Neutrophils Relative %: 46 %
Platelets: 84 10*3/uL — ABNORMAL LOW (ref 150–400)
RBC: 4.06 MIL/uL (ref 3.87–5.11)
RDW: 13 % (ref 11.5–15.5)
WBC: 3.4 10*3/uL — ABNORMAL LOW (ref 4.0–10.5)
nRBC: 0 % (ref 0.0–0.2)

## 2022-10-12 MED ORDER — PROMETHAZINE HCL 25 MG/ML IJ SOLN
INTRAMUSCULAR | Status: AC
  Filled 2022-10-12: qty 1

## 2022-10-12 MED ORDER — CETIRIZINE HCL 10 MG PO TABS: 10.0000 mg | ORAL_TABLET | Freq: Every day | ORAL | 0 refills | Status: AC

## 2022-10-12 MED ORDER — DIPHENHYDRAMINE HCL 50 MG/ML IJ SOLN
25.0000 mg | Freq: Once | INTRAMUSCULAR | Status: AC
  Administered 2022-10-12: 25 mg via INTRAVENOUS
  Filled 2022-10-12: qty 1

## 2022-10-12 MED ORDER — LIDOCAINE 5 % EX PTCH
1.0000 | MEDICATED_PATCH | Freq: Once | CUTANEOUS | Status: DC
  Administered 2022-10-12: 1 via TRANSDERMAL
  Filled 2022-10-12: qty 1

## 2022-10-12 MED ORDER — ACETAMINOPHEN 500 MG PO TABS
1000.0000 mg | ORAL_TABLET | Freq: Once | ORAL | Status: AC
  Administered 2022-10-12: 1000 mg via ORAL
  Filled 2022-10-12: qty 2

## 2022-10-12 MED ORDER — SODIUM CHLORIDE 0.9 % IV BOLUS
1000.0000 mL | Freq: Once | INTRAVENOUS | Status: AC
  Administered 2022-10-12: 1000 mL via INTRAVENOUS

## 2022-10-12 MED ORDER — PROMETHAZINE HCL 25 MG PO TABS: 25.0000 mg | ORAL_TABLET | Freq: Three times a day (TID) | ORAL | 0 refills | Status: AC | PRN

## 2022-10-12 MED ORDER — SODIUM CHLORIDE 0.9 % IV SOLN
25.0000 mg | Freq: Once | INTRAVENOUS | Status: AC
  Administered 2022-10-12: 25 mg via INTRAVENOUS
  Filled 2022-10-12: qty 1

## 2022-10-12 MED ORDER — OXYMETAZOLINE HCL 0.05 % NA SOLN: 1.0000 | Freq: Two times a day (BID) | NASAL | 0 refills | Status: AC

## 2022-10-12 NOTE — ED Provider Notes (Signed)
[facility] Provider Note  CSN: [id number] Arrival date & time: 10/12/22 [id number]  Chief Complaint(s) Headache  HPI Valerie Franco is a 52 y.o. female with past medical history as below, significant for depression, thyroid disease who presents to the ED with complaint of headache. Pt with headache over last 2 days, gradual in onset, throbbing/aching, frontal and radiating to top of head. No n/v, no change to bowel/bladder fxn, no fever. No sick contacts. No relief with home OTC medications. Worse from prior HA, feels different. Has not had migraine in multiple years   Past Medical History Past Medical History:  Diagnosis Date   Depression    Thyroid disease    There are no problems to display for this patient.  Home Medication(s) Prior to Admission medications   Medication Sig Start Date End Date Taking? Authorizing Provider  ALPRAZolam Duanne Moron) 0.5 MG tablet Take 0.5 mg by mouth 3 (three) times daily as needed. 09/25/22  Yes [provider]  lisdexamfetamine (VYVANSE) 60 MG capsule Take 60 mg by mouth every morning. 09/13/22 10/13/22 Yes [provider]  metroNIDAZOLE (METROGEL) 0.75 % vaginal gel Place 1 Applicatorful vaginally at bedtime. 10/10/22  Yes [provider]  nystatin ointment (MYCOSTATIN) Apply 1 Application topically 2 (two) times daily. 10/10/22 10/10/23 Yes [provider]  rOPINIRole (REQUIP) 0.25 MG tablet Take 0.25 mg by mouth 3 (three) times daily. 07/20/22  Yes [provider]  SKYRIZI PEN 150 MG/ML SOAJ Inject 1 each into the skin See admin instructions. Every 12 weeks 03/29/22  Yes [provider]  tiZANidine (ZANAFLEX) 4 MG tablet Take 4 mg by mouth at bedtime. 10/10/22 11/09/22 Yes [provider]  triamcinolone ointment (KENALOG) 0.5 % Apply 1 Application topically 2 (two) times daily. 10/10/22 10/10/23 Yes [provider]  augmented betamethasone dipropionate  (DIPROLENE-AF) 0.05 % ointment Apply topically as needed. For flare up     [provider]  citalopram (CELEXA) 40 MG tablet Take 40 mg by mouth daily.      [provider]  cyclobenzaprine (FLEXERIL) 10 MG tablet Take 1 tablet (10 mg total) by mouth 2 (two) times daily as needed for muscle spasms. 08/13/16   Gloriann Loan, PA-C  FLUoxetine (PROZAC) 20 MG capsule Take 20 mg by mouth daily.      [provider]  furosemide (LASIX) 20 MG tablet Take 1 tablet (20 mg total) by mouth daily. 05/31/18   Davonna Belling, MD  levothyroxine (SYNTHROID, LEVOTHROID) 75 MCG tablet Take 75 mcg by mouth daily.      [provider]                                                                                                                                    Past Surgical History Past Surgical History:  Procedure Laterality Date   NASAL FRACTURE SURGERY     Family History History  reviewed. No pertinent family history.  Social History Social History   Tobacco Use   Smoking status: Never   Smokeless tobacco: Never  Substance Use Topics   Alcohol use: Yes    Comment: oca   Drug use: No   Allergies Patient has no known allergies.  Review of Systems Review of Systems  Constitutional:  Negative for activity change and fever.  HENT:  Negative for facial swelling and trouble swallowing.   Eyes:  Negative for discharge and redness.  Respiratory:  Negative for cough and shortness of breath.   Cardiovascular:  Negative for chest pain and palpitations.  Gastrointestinal:  Negative for abdominal pain and nausea.  Genitourinary:  Negative for dysuria and flank pain.  Musculoskeletal:  Negative for back pain and gait problem.  Skin:  Negative for pallor and rash.  Neurological:  Positive for headaches. Negative for syncope.    Physical Exam Vital Signs  I have reviewed the triage vital signs BP 112/67 (BP Location: Right Arm)   Pulse 67   Temp 98 F (36.7 C)  (Oral)   Resp 14   Ht 5\' 5"  (1.651 m)   Wt 104.8 kg   SpO2 98%   BMI 38.44 kg/m  Physical Exam Vitals and nursing note reviewed.  Constitutional:      General: She is not in acute distress.    Appearance: Normal appearance.  HENT:     Head: Normocephalic and atraumatic.     Jaw: There is normal jaw occlusion. No trismus.     Comments: Mild pain with percussion of frontal sinus     Right Ear: External ear normal.     Left Ear: External ear normal.     Nose: Nose normal.     Mouth/Throat:     Mouth: Mucous membranes are moist.  Eyes:     General: No scleral icterus.       Right eye: No discharge.        Left eye: No discharge.     Extraocular Movements: Extraocular movements intact.     Pupils: Pupils are equal, round, and reactive to light.  Cardiovascular:     Rate and Rhythm: Normal rate and regular rhythm.     Pulses: Normal pulses.     Heart sounds: Normal heart sounds.  Pulmonary:     Effort: Pulmonary effort is normal. No respiratory distress.     Breath sounds: Normal breath sounds.  Abdominal:     General: Abdomen is flat.     Tenderness: There is no abdominal tenderness.  Musculoskeletal:     Cervical back: No rigidity.     Right lower leg: No edema.     Left lower leg: No edema.  Skin:    General: Skin is warm and dry.     Capillary Refill: Capillary refill takes less than 2 seconds.  Neurological:     Mental Status: She is alert.  Psychiatric:        Mood and Affect: Mood normal.        Behavior: Behavior normal.     ED Results and Treatments Labs (all labs ordered are listed, but only abnormal results are displayed) Labs Reviewed  CBC WITH DIFFERENTIAL/PLATELET - Abnormal; Notable for the following components:      Result Value   WBC 3.4 (*)    Platelets 84 (*)    Neutro Abs 1.6 (*)    All other components within normal limits  BASIC METABOLIC PANEL - Abnormal; Notable for the  following components:   Anion gap 3 (*)    All other components  within normal limits  HCG, SERUM, QUALITATIVE                                                                                                                          Radiology CT Head Wo Contrast  Result Date: 10/12/2022 CLINICAL DATA:  52 year old female with persistent headache for 2 days. Nasal drainage. EXAM: CT HEAD WITHOUT CONTRAST TECHNIQUE: Contiguous axial images were obtained from the base of the skull through the vertex without intravenous contrast. RADIATION DOSE REDUCTION: This exam was performed according to the departmental dose-optimization program which includes automated exposure control, adjustment of the mA and/or kV according to patient size and/or use of iterative reconstruction technique. COMPARISON:  None Available. FINDINGS: Brain: Cerebral volume is within normal limits for age. No midline shift, ventriculomegaly, mass effect, evidence of mass lesion, intracranial hemorrhage or evidence of cortically based acute infarction. Lowanda Cashaw-white matter differentiation is within normal limits throughout the brain. Vascular: Faint Calcified atherosclerosis at the skull base. No suspicious intracranial vascular hyperdensity. Skull: Negative. Sinuses/Orbits: Tympanic cavities, visualized paranasal sinuses and mastoids are well aerated. Unremarkable visible nasal cavity. Other: Visualized orbits and scalp soft tissues are within normal limits. IMPRESSION: Normal for age noncontrast Head CT. Electronically Signed   By: Genevie Ann M.D.   On: 10/12/2022 10:01    Pertinent labs & imaging results that were available during my care of the patient were reviewed by me and considered in my medical decision making (see MDM for details).  Medications Ordered in ED Medications  lidocaine (LIDODERM) 5 % 1 patch (1 patch Transdermal Patch Applied 10/12/22 1007)  promethazine (PHENERGAN) 25 mg in sodium chloride 0.9 % 50 mL IVPB (0 mg Intravenous Stopped 10/12/22 1030)  sodium chloride 0.9 % bolus 1,000 mL  (1,000 mLs Intravenous New Bag/Given 10/12/22 1006)  diphenhydrAMINE (BENADRYL) injection 25 mg (25 mg Intravenous Given 10/12/22 1004)  acetaminophen (TYLENOL) tablet 1,000 mg (1,000 mg Oral Given 10/12/22 1007)  promethazine (PHENERGAN) 25 MG/ML injection (  Given 10/12/22 1015)                                                                                                                                     Procedures Procedures  (including critical care time)  Medical Decision Making / ED Course    Medical Decision  Making:    SHYLIE GOETZE is a 52 y.o. female with past medical history as below, significant for depression, thyroid disease who presents to the ED with complaint of headache. . The complaint involves an extensive differential diagnosis and also carries with it a high risk of complications and morbidity.  Serious etiology was considered. Ddx includes but is not limited to: Differential diagnosis includes but is not exclusive to subarachnoid hemorrhage, meningitis, encephalitis, previous head trauma, cavernous venous thrombosis, muscle tension headache, glaucoma, temporal arteritis, migraine or migraine equivalent, etc.   Complete initial physical exam performed, notably the patient  was NAD, neuro non-focal.    Reviewed and confirmed nursing documentation for past medical history, family history, social history.  Vital signs reviewed.    Clinical Course as of 10/12/22 1140  Thu Oct 12, 2022  1034 [place] [facility] [SG]    Clinical Course User Index [SG] Jeanell Sparrow, DO   Patient with headache, she reports headache is different from her baseline, worsened in severity.  The headache was gradual in onset and has been intermittent over the past 2 days.  No vision changes.  No head trauma.  No improvement with home OTC medications.  Given Phenergan and fluids.  Tylenol, Benadryl and lidocaine patch.  Headache has resolved on recheck.  Labs and CT were stable.   Patient presents  with headache. Based on the patient's history and physical there is very low clinical suspicion for significant intracranial pathology. The headache was not sudden onset, not maximal at onset, there are no neurologic findings on exam, the patient does not have a fever, the patient does not have any jaw claudication, the patient does not endorse a clotting disorder, patient denies any trauma or eye pain and the headache is not associated with dizziness, weakness on one side of the body, diplopia, vertigo, slurred speech, or ataxia. Given the extremely low risk of these diagnoses further testing and evaluation for these possibilities does not appear to be indicated at this time.    The patient improved significantly and was discharged in stable condition. Detailed discussions were had with the patient regarding current findings, and need for close f/u with PCP or on call doctor. The patient has been instructed to return immediately if the symptoms worsen in any way for re-evaluation. Patient verbalized understanding and is in agreement with current care plan. All questions answered prior to discharge.  Additional history obtained: -Additional history obtained from na -External records from outside source obtained and reviewed including: Chart review including previous notes, labs, imaging, consultation notes including primary care documentation, home medications, prior labs and imaging   Lab Tests: -I ordered, reviewed, and interpreted labs.   The pertinent results include:   Labs Reviewed  CBC WITH DIFFERENTIAL/PLATELET - Abnormal; Notable for the following components:      Result Value   WBC 3.4 (*)    Platelets 84 (*)    Neutro Abs 1.6 (*)    All other components within normal limits  BASIC METABOLIC PANEL - Abnormal; Notable for the following components:   Anion gap 3 (*)    All other components within normal limits  HCG, SERUM, QUALITATIVE    Notable for stable as above  EKG   EKG  Interpretation  Date/Time:    Ventricular Rate:    PR Interval:    QRS Duration:   QT Interval:    QTC Calculation:   R Axis:     Text Interpretation:  Imaging Studies ordered: I ordered imaging studies including CT head I independently visualized the following imaging with scope of interpretation limited to determining acute life threatening conditions related to emergency care; findings noted above, significant for no acute changes I independently visualized and interpreted imaging. I agree with the radiologist interpretation   Medicines ordered and prescription drug management: Meds ordered this encounter  Medications   promethazine (PHENERGAN) 25 mg in sodium chloride 0.9 % 50 mL IVPB   sodium chloride 0.9 % bolus 1,000 mL   diphenhydrAMINE (BENADRYL) injection 25 mg   acetaminophen (TYLENOL) tablet 1,000 mg   lidocaine (LIDODERM) 5 % 1 patch   promethazine (PHENERGAN) 25 MG/ML injection    Colin Ina R: cabinet override    -I have reviewed the patients home medicines and have made adjustments as needed   Consultations Obtained: na   Cardiac Monitoring: na  Social Determinants of Health:  Diagnosis or treatment significantly limited by social determinants of health: obesity   Reevaluation: After the interventions noted above, I reevaluated the patient and found that they have resolved  Co morbidities that complicate the patient evaluation  Past Medical History:  Diagnosis Date   Depression    Thyroid disease       Dispostion: Disposition decision including need for hospitalization was considered, and patient discharged from emergency department.    Final Clinical Impression(s) / ED Diagnoses Final diagnoses:  None     This chart was dictated using voice recognition software.  Despite best efforts to proofread,  errors can occur which can change the documentation meaning.    Wynona Dove A, DO 10/12/22 1140

## 2022-10-12 NOTE — ED Notes (Signed)
Patient verbalizes understanding of discharge instructions. Opportunity for questioning and answers were provided. Patient discharged from ED.  °

## 2022-10-12 NOTE — ED Triage Notes (Signed)
Pt arrives to ED with c/o headache x2 days. Located frontal. She notes sinus pain/pressure with associated nasal drainage. Headache described as achy.

## 2022-10-12 NOTE — Discharge Instructions (Addendum)
It was a pleasure caring for you today in the emergency department.  Please return to the emergency department for any worsening or worrisome symptoms.  Recommend extra rest over the next couple days, drink lots of fluids.  Avoid triggers of headaches.  Headache may be associated with sinus pressure/congestion. Recommend antihistamine daily and nasal spray. Perform sinus rinse as needed

## 2023-05-30 ENCOUNTER — Ambulatory Visit: Payer: BC Managed Care – PPO | Admitting: Podiatry

## 2023-06-07 ENCOUNTER — Encounter: Payer: Self-pay | Admitting: Podiatry

## 2023-06-07 ENCOUNTER — Ambulatory Visit: Payer: BC Managed Care – PPO | Admitting: Podiatry

## 2023-06-07 DIAGNOSIS — L600 Ingrowing nail: Secondary | ICD-10-CM

## 2023-06-07 NOTE — Patient Instructions (Signed)

## 2023-06-08 NOTE — Progress Notes (Signed)
Subjective:   Patient ID: Valerie Franco, female   DOB: 52 y.o.   MRN: [id number]   HPI Patient presents stating she has had a chronic ingrown toenail on her right big toe and it has been there since she was a young girl but its gotten worse over the years and she tries to trim it out herself and is not able to do this appropriately anymore.  States it does get sore and it has been infected she been on antibiotics in the past.  Patient does not smoke likes to be active   Review of Systems  All other systems reviewed and are negative.       Objective:  Physical Exam Vitals and nursing note reviewed.  Constitutional:      Appearance: She is well-developed.  Pulmonary:     Effort: Pulmonary effort is normal.  Musculoskeletal:        General: Normal range of motion.  Skin:    General: Skin is warm.  Neurological:     Mental Status: She is alert.     Neurovascular status intact muscle strength adequate range of motion within normal limits with patient found to have a incurvated right hallux lateral border that is painful when pressed and make shoe gear difficult.  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  I did explain procedure and risk and patient is aware of this and signed consent form.  I infiltrated the right big toe 60 mg like Marcaine mixture sterile prep done using sterile instrumentation remove the lateral border exposed matrix applied phenol 3 applications 30 seconds followed by alcohol lavage sterile dressing gave instructions on soaks wear dressing 24 hours taken off earlier if throbbing were to occur and encouraged her to call with questions concerns which might arise during healing

## 2023-06-12 ENCOUNTER — Encounter: Payer: Self-pay | Admitting: Podiatry

## 2023-06-13 ENCOUNTER — Other Ambulatory Visit: Payer: Self-pay | Admitting: Podiatry

## 2023-06-13 MED ORDER — DOXYCYCLINE MONOHYDRATE 100 MG PO CAPS: 100.0000 mg | ORAL_CAPSULE | Freq: Two times a day (BID) | ORAL | 1 refills | Status: AC
